# Patient Record
Sex: Male | Born: 2000 | Race: White | Hispanic: No | Marital: Single
Health system: Southern US, Community
[De-identification: ages and names within clinical notes are randomized; demographics above are authoritative.]

## PROBLEM LIST (undated history)

## (undated) DIAGNOSIS — F988 Other specified behavioral and emotional disorders with onset usually occurring in childhood and adolescence: Secondary | ICD-10-CM

---
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
## 2021-10-02 ENCOUNTER — Encounter: Payer: Self-pay | Admitting: Emergency Medicine

## 2021-10-02 ENCOUNTER — Ambulatory Visit
Admission: EM | Admit: 2021-10-02 | Discharge: 2021-10-02 | Disposition: A | Discharge status: Home / Self Care | Payer: Medicaid Other | Attending: Internal Medicine | Admitting: Internal Medicine

## 2021-10-02 DIAGNOSIS — Z20822 Contact with and (suspected) exposure to covid-19: Secondary | ICD-10-CM | POA: Insufficient documentation

## 2021-10-02 DIAGNOSIS — R059 Cough, unspecified: Secondary | ICD-10-CM | POA: Diagnosis not present

## 2021-10-02 DIAGNOSIS — B349 Viral infection, unspecified: Secondary | ICD-10-CM | POA: Insufficient documentation

## 2021-10-02 DIAGNOSIS — J069 Acute upper respiratory infection, unspecified: Secondary | ICD-10-CM | POA: Insufficient documentation

## 2021-10-02 LAB — RESP PANEL BY RT-PCR (FLU A&B, COVID) ARPGX2
Influenza A by PCR: NEGATIVE
Influenza B by PCR: NEGATIVE
SARS Coronavirus 2 by RT PCR: NEGATIVE

## 2021-10-02 MED ORDER — PROMETHAZINE-DM 6.25-15 MG/5ML PO SYRP: 5.0000 mL | ORAL_SOLUTION | Freq: Four times a day (QID) | ORAL | 0 refills | Status: AC | PRN

## 2021-10-02 NOTE — ED Provider Notes (Signed)
?[facility] ? ? ? ?CSN: [id number] ?Arrival date & time: 10/02/21  1127 ? ? ?  ? ?History   ?Chief Complaint ?Chief Complaint  ?Patient presents with  ? Fever  ? Nasal Congestion  ? ? ?HPI ?David Brady is a 21 y.o. male comes to the urgent care with 1 day history of nasal congestion, loss of taste and chills.  Patient also endorses a fever of 101 ?F.  Other family members have had similar symptoms.  Patient endorses a cough which is not productive of sputum. ? ?Patient is not vaccinated against COVID-19 virus.  No nausea, vomiting or diarrhea.  No generalized body aches.  No shortness of breath or wheezing.  ? ?HPI ? ?Past Medical History:  ?Diagnosis Date  ? Attention deficit disorder (ADD)   ? ? ?There are no problems to display for this patient. ? ? ?History reviewed. No pertinent surgical history. ? ? ? ? ?Home Medications   ? ?Prior to Admission medications   ?Medication Sig Start Date End Date Taking? Authorizing Provider  ?promethazine-dextromethorphan (PROMETHAZINE-DM) 6.25-15 MG/5ML syrup Take 5 mLs by mouth 4 (four) times daily as needed for cough. 10/02/21  Yes Kealii Thueson, Britta Mccreedy, MD  ?cloNIDine (CATAPRES) 0.3 MG tablet Take 2 mg by mouth 2 (two) times daily.    [provider]  ?guanFACINE (INTUNIV) 2 MG TB24 ER tablet TK 1 AND 1/2 T PO QD FOR 1 WK THEN 2 TS ONCE PER DAY 04/25/17   [provider]  ?GuanFACINE HCl (INTUNIV) 4 MG TB24 Take 1 tablet by mouth daily.      [provider]  ?methylphenidate (METADATE CD) 40 MG CR capsule Take 40 mg by mouth every morning.      [provider]  ?methylphenidate 36 MG PO CR tablet Take 36 mg by mouth daily.    [provider]  ?ondansetron (ZOFRAN-ODT) 8 MG disintegrating tablet DIS 1 T ON THE TONGUE Q 8 H FOR 2 DAYS 04/30/17   [provider]  ? ? ?Family History ?History reviewed. No pertinent family history. ? ?Social History ?Social History  ? ?Tobacco Use  ? Smoking status: Unknown   ? ? ? ?Allergies   ?Patient has no known allergies. ? ? ?Review of Systems ?Review of Systems ?As per HPI ? ? ? ?Physical Exam ?Triage Vital Signs ?ED Triage Vitals  ?Enc Vitals Group  ?   BP 10/02/21 1330 111/77  ?   Pulse Rate 10/02/21 1330 62  ?   Resp 10/02/21 1330 18  ?   Temp 10/02/21 1330 98.3 ?F (36.8 ?C)  ?   Temp Source 10/02/21 1330 Oral  ?   SpO2 10/02/21 1330 100 %  ?   Weight 10/02/21 1328 120 lb (54.4 kg)  ?   Height 10/02/21 1328 5\' 7"  (1.702 m)  ?   Head Circumference --   ?   Peak Flow --   ?   Pain Score 10/02/21 1327 0  ?   Pain Loc --   ?   Pain Edu? --   ?   Excl. in GC? --   ? ?No data found. ? ?Updated Vital Signs ?BP 111/77 (BP Location: Left Arm)   Pulse 62   Temp 98.3 ?F (36.8 ?C) (Oral)   Resp 18   Ht 5\' 7"  (1.702 m)   Wt 54.4 kg   SpO2 100%   BMI 18.79 kg/m?  ? ?Visual Acuity ?Right Eye Distance:   ?Left Eye Distance:   ?  Bilateral Distance:   ? ?Right Eye Near:   ?Left Eye Near:    ?Bilateral Near:    ? ?Physical Exam ?Vitals and nursing note reviewed.  ?Constitutional:   ?   General: He is not in acute distress. ?   Appearance: He is not ill-appearing.  ?HENT:  ?   Right Ear: Tympanic membrane normal.  ?   Left Ear: Tympanic membrane normal.  ?   Mouth/Throat:  ?   Pharynx: No posterior oropharyngeal erythema.  ?Eyes:  ?   Pupils: Pupils are equal, round, and reactive to light.  ?Cardiovascular:  ?   Rate and Rhythm: Normal rate and regular rhythm.  ?   Pulses: Normal pulses.  ?   Heart sounds: Normal heart sounds.  ?Pulmonary:  ?   Effort: Pulmonary effort is normal.  ?   Breath sounds: Normal breath sounds.  ?Abdominal:  ?   General: Bowel sounds are normal.  ?   Palpations: Abdomen is soft.  ?Neurological:  ?   Mental Status: He is alert.  ? ? ? ?UC Treatments / Results  ?Labs ?(all labs ordered are listed, but only abnormal results are displayed) ?Labs Reviewed  ?RESP PANEL BY RT-PCR (FLU A&B, COVID) ARPGX2  ? ? ?EKG ? ? ?Radiology ?No results  found. ? ?Procedures ?Procedures (including critical care time) ? ?Medications Ordered in UC ?Medications - No data to display ? ?Initial Impression / Assessment and Plan / UC Course  ?I have reviewed the triage vital signs and the nursing notes. ? ?Pertinent labs & imaging results that were available during my care of the patient were reviewed by me and considered in my medical decision making (see chart for details). ? ?  ? ?1.  Viral URI with cough: ?Maintain adequate hydration ?COVID-19 PCR test has been sent ?Tylenol/Motrin as needed for fever and/or body aches ?Promethazine-DM as needed for cough ?Return to urgent care if symptoms worsen. ? ?Final Clinical Impressions(s) / UC Diagnoses  ? ?Final diagnoses:  ?Viral illness  ?Viral URI with cough  ? ? ? ?Discharge Instructions   ? ?  ?Please increase oral fluid intake ?Please take medication as prescribed ?Tylenol/Motrin as needed for pain/fever ?We will call you with recommendation if labs are abnormal ?Return to urgent care if symptoms persist or worsens ? ? ?ED Prescriptions   ? ? Medication Sig Dispense Auth. Provider  ? promethazine-dextromethorphan (PROMETHAZINE-DM) 6.25-15 MG/5ML syrup Take 5 mLs by mouth 4 (four) times daily as needed for cough. 118 mL Clarence Dunsmore, Britta Mccreedy, MD  ? ?  ? ?PDMP not reviewed this encounter. ?  ?Merrilee Jansky, MD ?10/02/21 1608 ? ?

## 2021-10-02 NOTE — Discharge Instructions (Addendum)
Please increase oral fluid intake ?Please take medication as prescribed ?Tylenol/Motrin as needed for pain/fever ?We will call you with recommendation if labs are abnormal ?Return to urgent care if symptoms persist or worsens ?

## 2021-10-02 NOTE — ED Triage Notes (Addendum)
Pt reports fever (101 F) and nasal congestion x 3 days. States he also has "covid symptoms". States he has experienced loss of taste and sensitivity to cold. States have taken OTC Advil for fever management. Last dose was yesterday evening.  ?
